# Patient Record
Sex: Female | Born: 1995 | Hispanic: No | Marital: Single | Smoking: Never smoker
Health system: Southern US, Community
[De-identification: ages and names within clinical notes are randomized; demographics above are authoritative.]

## PROBLEM LIST (undated history)

## (undated) DIAGNOSIS — M797 Fibromyalgia: Secondary | ICD-10-CM

## (undated) DIAGNOSIS — K589 Irritable bowel syndrome without diarrhea: Secondary | ICD-10-CM

## (undated) DIAGNOSIS — N200 Calculus of kidney: Secondary | ICD-10-CM

---

## 2020-01-31 ENCOUNTER — Emergency Department (HOSPITAL_COMMUNITY)
Admission: EM | Admit: 2020-01-31 | Discharge: 2020-01-31 | Disposition: A | Discharge status: Home / Self Care | Payer: No Typology Code available for payment source | Attending: Emergency Medicine | Admitting: Emergency Medicine

## 2020-01-31 ENCOUNTER — Encounter (HOSPITAL_COMMUNITY): Payer: Self-pay | Admitting: Emergency Medicine

## 2020-01-31 ENCOUNTER — Other Ambulatory Visit: Payer: Self-pay

## 2020-01-31 ENCOUNTER — Emergency Department (HOSPITAL_COMMUNITY): Payer: No Typology Code available for payment source

## 2020-01-31 DIAGNOSIS — N23 Unspecified renal colic: Secondary | ICD-10-CM | POA: Insufficient documentation

## 2020-01-31 DIAGNOSIS — R102 Pelvic and perineal pain: Secondary | ICD-10-CM

## 2020-01-31 DIAGNOSIS — Z79899 Other long term (current) drug therapy: Secondary | ICD-10-CM | POA: Diagnosis not present

## 2020-01-31 DIAGNOSIS — R109 Unspecified abdominal pain: Secondary | ICD-10-CM | POA: Diagnosis present

## 2020-01-31 LAB — COMPREHENSIVE METABOLIC PANEL
ALT: 13 U/L (ref 0–44)
AST: 16 U/L (ref 15–41)
Albumin: 3.8 g/dL (ref 3.5–5.0)
Alkaline Phosphatase: 79 U/L (ref 38–126)
Anion gap: 12 (ref 5–15)
BUN: 9 mg/dL (ref 6–20)
CO2: 25 mmol/L (ref 22–32)
Calcium: 9.4 mg/dL (ref 8.9–10.3)
Chloride: 102 mmol/L (ref 98–111)
Creatinine, Ser: 0.97 mg/dL (ref 0.44–1.00)
GFR calc Af Amer: >60 mL/min (ref >60)
GFR calc non Af Amer: >60 mL/min (ref >60)
Glucose, Bld: 100 mg/dL — ABNORMAL HIGH (ref 70–99)
Potassium: 3.8 mmol/L (ref 3.5–5.1)
Sodium: 139 mmol/L (ref 135–145)
Total Bilirubin: 0.5 mg/dL (ref 0.3–1.2)
Total Protein: 7.1 g/dL (ref 6.5–8.1)

## 2020-01-31 LAB — CBC WITH DIFFERENTIAL/PLATELET
Abs Immature Granulocytes: 0.01 10*3/uL (ref 0.00–0.07)
Basophils Absolute: 0.1 10*3/uL (ref 0.0–0.1)
Basophils Relative: 1 %
Eosinophils Absolute: 0.1 10*3/uL (ref 0.0–0.5)
Eosinophils Relative: 2 %
HCT: 44.3 % (ref 36.0–46.0)
Hemoglobin: 14.1 g/dL (ref 12.0–15.0)
Immature Granulocytes: 0 %
Lymphocytes Relative: 31 %
Lymphs Abs: 2.3 10*3/uL (ref 0.7–4.0)
MCH: 27.9 pg (ref 26.0–34.0)
MCHC: 31.8 g/dL (ref 30.0–36.0)
MCV: 87.5 fL (ref 80.0–100.0)
Monocytes Absolute: 0.4 10*3/uL (ref 0.1–1.0)
Monocytes Relative: 5 %
Neutro Abs: 4.5 10*3/uL (ref 1.7–7.7)
Neutrophils Relative %: 61 %
Platelets: 363 10*3/uL (ref 150–400)
RBC: 5.06 MIL/uL (ref 3.87–5.11)
RDW: 12.4 % (ref 11.5–15.5)
WBC: 7.4 10*3/uL (ref 4.0–10.5)
nRBC: 0 % (ref 0.0–0.2)

## 2020-01-31 LAB — URINALYSIS, ROUTINE W REFLEX MICROSCOPIC
Bacteria, UA: NONE SEEN
Bilirubin Urine: NEGATIVE
Glucose, UA: NEGATIVE mg/dL
Ketones, ur: NEGATIVE mg/dL
Leukocytes,Ua: NEGATIVE
Nitrite: NEGATIVE
Protein, ur: NEGATIVE mg/dL
RBC / HPF: >50 RBC/hpf — ABNORMAL HIGH (ref 0–5)
Specific Gravity, Urine: 1.021 (ref 1.005–1.030)
pH: 5 (ref 5.0–8.0)

## 2020-01-31 LAB — POC URINE PREG, ED: Preg Test, Ur: NEGATIVE

## 2020-01-31 MED ORDER — MORPHINE SULFATE (PF) 4 MG/ML IV SOLN
4.0000 mg | Freq: Once | INTRAVENOUS | Status: AC
  Administered 2020-01-31: 4 mg via INTRAVENOUS
  Filled 2020-01-31: qty 1

## 2020-01-31 MED ORDER — HYDROCODONE-ACETAMINOPHEN 5-325 MG PO TABS: 2.0000 | ORAL_TABLET | ORAL | 0 refills | Status: AC | PRN

## 2020-01-31 MED ORDER — SODIUM CHLORIDE 0.9 % IV BOLUS
1000.0000 mL | Freq: Once | INTRAVENOUS | Status: AC
  Administered 2020-01-31: 1000 mL via INTRAVENOUS

## 2020-01-31 NOTE — ED Triage Notes (Signed)
Pt reports RLQ and R flank pain since last night, has known renal stones that were found in November at another hospital. Pt denies any hematuria or other urinary symptoms. Tachycardic In triage, but states she has been seeing cards for the same. resp e/u, nad.

## 2020-01-31 NOTE — ED Provider Notes (Signed)
[facility] EMERGENCY DEPARTMENT Provider Note   CSN: [id number] Arrival date & time: 01/31/20  1142     History Chief Complaint  Patient presents with  . Flank Pain    Cheyenne Koch is a 24 y.o. female.  HPI 24 year old presents with left lower abdominal pain.  Started yesterday.  Much worse throughout the night.  Has had kidney stones before and similar pain in her left side where she was diagnosed with kidney stones in November.  States she was told she had many in her kidney that had not come out yet but were likely all small enough to pass.  No dysuria, hematuria, vaginal bleeding, or discharge.  No fevers.  She vomited once last night and feels nauseated today.  Took ibuprofen as well as her chronic fibromyalgia meds without help.  She has chronic back pain but no new back pain.  Pain is about 8 out of 10.  History reviewed. No pertinent past medical history.  There are no problems to display for this patient.   History reviewed. No pertinent surgical history.   OB History   No obstetric history on file.     No family history on file.  Social History   Tobacco Use  . Smoking status: Not on file  Substance Use Topics  . Alcohol use: Not on file  . Drug use: Not on file    Home Medications Prior to Admission medications   Medication Sig Start Date End Date Taking? Authorizing Provider  cholecalciferol (VITAMIN D3) 25 MCG (1000 UNIT) tablet Take 1,000 Units by mouth daily.   Yes [provider]  DULoxetine (CYMBALTA) 20 MG capsule Take 20 mg by mouth daily.   Yes [provider]  ibuprofen (ADVIL) 200 MG tablet Take 200 mg by mouth every 6 (six) hours as needed for moderate pain.   Yes [provider]  pregabalin (LYRICA) 25 MG capsule Take 25 mg by mouth 3 (three) times daily. 11/14/19  Yes [provider]  HYDROcodone-acetaminophen (NORCO) 5-325 MG tablet Take 2 tablets by mouth every 4 (four) hours as  needed. 01/31/20   Pricilla Loveless, MD    Allergies    Patient has no known allergies.  Review of Systems   Review of Systems  Constitutional: Negative for fever.  Gastrointestinal: Positive for abdominal pain, nausea and vomiting.  Genitourinary: Negative for dysuria, hematuria, vaginal bleeding and vaginal discharge.  Musculoskeletal: Positive for back pain.  All other systems reviewed and are negative.   Physical Exam Updated Vital Signs BP 110/63   Pulse 62   Temp 98.2 F (36.8 C) (Oral)   Resp 16   SpO2 97%   Physical Exam Vitals and nursing note reviewed.  Constitutional:      General: She is not in acute distress.    Appearance: She is well-developed. She is not ill-appearing or diaphoretic.  HENT:     Head: Normocephalic and atraumatic.     Right Ear: External ear normal.     Left Ear: External ear normal.     Nose: Nose normal.  Eyes:     General:        Right eye: No discharge.        Left eye: No discharge.  Cardiovascular:     Rate and Rhythm: Regular rhythm. Tachycardia present.     Heart sounds: Normal heart sounds.  Pulmonary:     Effort: Pulmonary effort is normal.     Breath sounds: Normal  breath sounds.  Abdominal:     Palpations: Abdomen is soft.     Tenderness: There is no abdominal tenderness (palpation of LLQ induces a "funny feeling" but no real tenderness). There is no right CVA tenderness or left CVA tenderness.  Skin:    General: Skin is warm and dry.  Neurological:     Mental Status: She is alert.  Psychiatric:        Mood and Affect: Mood is not anxious.     ED Results / Procedures / Treatments   Labs (all labs ordered are listed, but only abnormal results are displayed) Labs Reviewed  COMPREHENSIVE METABOLIC PANEL - Abnormal; Notable for the following components:      Result Value   Glucose, Bld 100 (*)    All other components within normal limits  URINALYSIS, ROUTINE W REFLEX MICROSCOPIC - Abnormal; Notable for the following  components:   APPearance HAZY (*)    Hgb urine dipstick SMALL (*)    RBC / HPF >50 (*)    All other components within normal limits  CBC WITH DIFFERENTIAL/PLATELET  POC URINE PREG, ED    EKG None  Radiology US Renal  Result Date: 01/31/2020 CLINICAL DATA:  24 year old with current history of urinary tract calculi presenting with LEFT flank pain. EXAM: RENAL / URINARY TRACT ULTRASOUND COMPLETE COMPARISON:  CT abdomen and pelvis 10/23/2019 and earlier from [place]. FINDINGS: Right Kidney: Renal measurements: Approximately 10.1 x 4.2 x 4.3 cm = volume: 96.4 mL . No hydronephrosis. Well-preserved cortex. Normal parenchymal echotexture. No focal parenchymal abnormality. Scattered nonshadowing echogenic foci which may represent the very small calculi identified on the prior CT. Left Kidney: Renal measurements: Approximately 9.8 x 4.3 x 4.7 cm = volume: 102.8 mL. No hydronephrosis. Well-preserved cortex. Normal parenchymal echotexture. No focal parenchymal abnormality. Scattered nonshadowing echogenic foci which may represent a very small calculi identified on the prior CT. Bladder: Normal in appearance. BILATERAL ureteral jets visible at color Doppler evaluation. Other: None. IMPRESSION: 1. Nonshadowing echogenic foci in both kidneys which may represent the very small BILATERAL renal calculi noted on the prior CT. 2. Otherwise normal examination. Specifically, no evidence of LEFT urinary tract obstruction. Electronically Signed   By: Evangeline Dakin M.D.   On: 01/31/2020 13:56   US PELVIC COMPLETE W TRANSVAGINAL AND TORSION R/O  Result Date: 01/31/2020 CLINICAL DATA:  24 year old presenting with LEFT-sided pelvic pain. LMP 2 weeks ago. EXAM: TRANSABDOMINAL AND TRANSVAGINAL ULTRASOUND OF PELVIS DOPPLER ULTRASOUND OF OVARIES TECHNIQUE: Both transabdominal and transvaginal ultrasound examinations of the pelvis were performed. Transabdominal technique was performed  for global imaging of the pelvis including uterus, ovaries, adnexal regions, and pelvic cul-de-sac. It was necessary to proceed with endovaginal exam following the transabdominal exam to visualize the endometrium and ovaries due to incomplete bladder distension. Color and duplex Doppler ultrasound was utilized to evaluate blood flow to the ovaries. COMPARISON:  No prior ultrasound. CT abdomen and pelvis 10/23/2019 at [place]. FINDINGS: Uterus Measurements: Approximately 8.2 x 3.4 x 4.3 cm = volume: 82.9 mL. Homogeneous echotexture without focal fibroid or other myometrial abnormality. Normal-appearing uterine cervix. Endometrium Thickness: Approximately 3 mm. Normal appearance without evidence of endometrial fluid or mass. Right ovary Measurements: Approximately 3.0 x 1.7 x 1.9 cm = volume: 5.2 mL. Small follicular cysts. No dominant cyst or solid mass. Normal color Doppler flow within the ovary. Left ovary Measurements: Approximately 3.1 x 2.2 x 2.3 cm = volume: 7.9 mL.  Small follicular cysts. No dominant cyst or solid mass. Normal color Doppler flow within the ovary. Pulsed Doppler evaluation of both ovaries demonstrates normal low-resistance arterial and venous waveforms. IMPRESSION: Normal examination. Electronically Signed   By: Hulan Saas M.D.   On: 01/31/2020 14:00    Procedures Procedures (including critical care time)  Medications Ordered in ED Medications  morphine 4 MG/ML injection 4 mg (4 mg Intravenous Given 01/31/20 1243)  sodium chloride 0.9 % bolus 1,000 mL (0 mLs Intravenous Stopped 01/31/20 1404)    ED Course  I have reviewed the triage vital signs and the nursing notes.  Pertinent labs & imaging results that were available during my care of the patient were reviewed by me and considered in my medical decision making (see chart for details).    MDM Rules/Calculators/A&P                      Repeat abdominal exam shows a continued  benign abdomen.  She has had pain like this before with possible ureteral colic.  My suspicion is this is probably mild ureteral colic given the hematuria and her known kidney stones.  I do not see an indication for CT as at this point she has had multiple CTs and this would increase her radiation load and likely not change management.  My suspicion for other intra-abdominal emergency is very low, such as diverticulitis, AAA, etc.  No torsion on ultrasound.  No GYN complaints.  Will treat pain and have her follow-up with her urologist. Final Clinical Impression(s) / ED Diagnoses Final diagnoses:  Ureteral colic    Rx / DC Orders ED Discharge Orders         Ordered    HYDROcodone-acetaminophen (NORCO) 5-325 MG tablet  Every 4 hours PRN     01/31/20 1450           Pricilla Loveless, MD 01/31/20 1515

## 2020-01-31 NOTE — Discharge Instructions (Signed)
If you develop worsening, continued, or recurrent abdominal pain, uncontrolled vomiting, fever, chest or back pain, or any other new/concerning symptoms then return to the ER for evaluation.  

## 2020-03-24 ENCOUNTER — Emergency Department (HOSPITAL_COMMUNITY): Payer: Self-pay

## 2020-03-24 ENCOUNTER — Encounter (HOSPITAL_COMMUNITY): Payer: Self-pay | Admitting: Emergency Medicine

## 2020-03-24 ENCOUNTER — Emergency Department (HOSPITAL_COMMUNITY)
Admission: EM | Admit: 2020-03-24 | Discharge: 2020-03-25 | Disposition: A | Discharge status: Home / Self Care | Payer: Self-pay | Attending: Emergency Medicine | Admitting: Emergency Medicine

## 2020-03-24 ENCOUNTER — Other Ambulatory Visit: Payer: Self-pay

## 2020-03-24 DIAGNOSIS — R1032 Left lower quadrant pain: Secondary | ICD-10-CM | POA: Insufficient documentation

## 2020-03-24 DIAGNOSIS — Z5321 Procedure and treatment not carried out due to patient leaving prior to being seen by health care provider: Secondary | ICD-10-CM | POA: Insufficient documentation

## 2020-03-24 HISTORY — DX: Calculus of kidney: N20.0

## 2020-03-24 HISTORY — DX: Irritable bowel syndrome, unspecified: K58.9

## 2020-03-24 HISTORY — DX: Fibromyalgia: M79.7

## 2020-03-24 LAB — URINALYSIS, ROUTINE W REFLEX MICROSCOPIC
Bilirubin Urine: NEGATIVE
Glucose, UA: NEGATIVE mg/dL
Ketones, ur: NEGATIVE mg/dL
Leukocytes,Ua: NEGATIVE
Nitrite: NEGATIVE
Protein, ur: 30 mg/dL — AB
RBC / HPF: >50 RBC/hpf — ABNORMAL HIGH (ref 0–5)
Specific Gravity, Urine: 1.025 (ref 1.005–1.030)
pH: 6 (ref 5.0–8.0)

## 2020-03-24 LAB — COMPREHENSIVE METABOLIC PANEL
ALT: 14 U/L (ref 0–44)
AST: 17 U/L (ref 15–41)
Albumin: 4.1 g/dL (ref 3.5–5.0)
Alkaline Phosphatase: 77 U/L (ref 38–126)
Anion gap: 12 (ref 5–15)
BUN: 12 mg/dL (ref 6–20)
CO2: 23 mmol/L (ref 22–32)
Calcium: 9.4 mg/dL (ref 8.9–10.3)
Chloride: 105 mmol/L (ref 98–111)
Creatinine, Ser: 0.95 mg/dL (ref 0.44–1.00)
GFR calc Af Amer: >60 mL/min (ref >60)
GFR calc non Af Amer: >60 mL/min (ref >60)
Glucose, Bld: 106 mg/dL — ABNORMAL HIGH (ref 70–99)
Potassium: 3.9 mmol/L (ref 3.5–5.1)
Sodium: 140 mmol/L (ref 135–145)
Total Bilirubin: 0.8 mg/dL (ref 0.3–1.2)
Total Protein: 7.9 g/dL (ref 6.5–8.1)

## 2020-03-24 LAB — CBC
HCT: 47.7 % — ABNORMAL HIGH (ref 36.0–46.0)
Hemoglobin: 15.4 g/dL — ABNORMAL HIGH (ref 12.0–15.0)
MCH: 28.3 pg (ref 26.0–34.0)
MCHC: 32.3 g/dL (ref 30.0–36.0)
MCV: 87.5 fL (ref 80.0–100.0)
Platelets: 425 10*3/uL — ABNORMAL HIGH (ref 150–400)
RBC: 5.45 MIL/uL — ABNORMAL HIGH (ref 3.87–5.11)
RDW: 13 % (ref 11.5–15.5)
WBC: 12.9 10*3/uL — ABNORMAL HIGH (ref 4.0–10.5)
nRBC: 0 % (ref 0.0–0.2)

## 2020-03-24 LAB — I-STAT BETA HCG BLOOD, ED (MC, WL, AP ONLY): I-stat hCG, quantitative: <5 m[IU]/mL (ref <5)

## 2020-03-24 LAB — LIPASE, BLOOD: Lipase: 24 U/L (ref 11–51)

## 2020-03-24 MED ORDER — ACETAMINOPHEN 325 MG PO TABS
650.0000 mg | ORAL_TABLET | Freq: Once | ORAL | Status: AC
  Administered 2020-03-24: 650 mg via ORAL
  Filled 2020-03-24: qty 2

## 2020-03-24 MED ORDER — SODIUM CHLORIDE 0.9% FLUSH: 3.0000 mL | Freq: Once | INTRAVENOUS | Status: DC

## 2020-03-24 NOTE — ED Triage Notes (Signed)
Pt arrives to ED from home with complaints of sharp LLQ abdominal pain starting this morning. Patient states that she has had x5 episodes of diarhhrea and x5 episodes of emesis. Patient has hx of kidney stones that feels like same.

## 2020-03-25 NOTE — ED Notes (Signed)
No answer x2 for vitals recheck 

## 2020-03-25 NOTE — ED Notes (Signed)
No answer x1 for vitals recheck 

## 2020-07-12 IMAGING — CT CT RENAL STONE PROTOCOL
2 of 4 series · 17 of 46 positions shown, 19 images · non-contrast
Comparison: October 23, 2019

CLINICAL DATA: Left flank pain.

EXAM:
CT ABDOMEN AND PELVIS WITHOUT CONTRAST
TECHNIQUE: Multidetector CT imaging of the abdomen and pelvis was performed
following the standard protocol without IV contrast.

[Series 3: renal stone 5.0 · axial · 0.85mm/px · z∈[+769,+1194]mm · 14 of 93 slices shown, 16 images]
[im 4/93  soft-tissue]
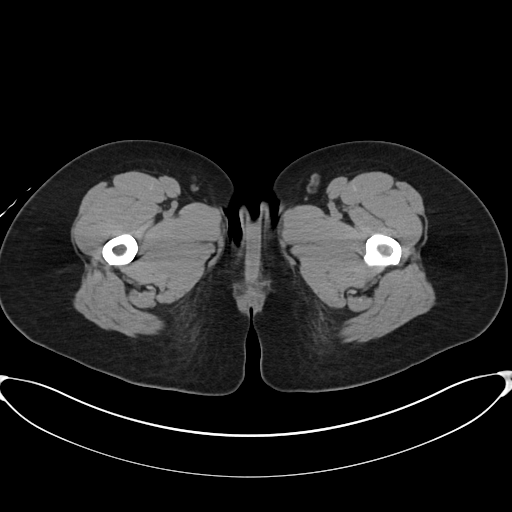
[im 4/93  bone]
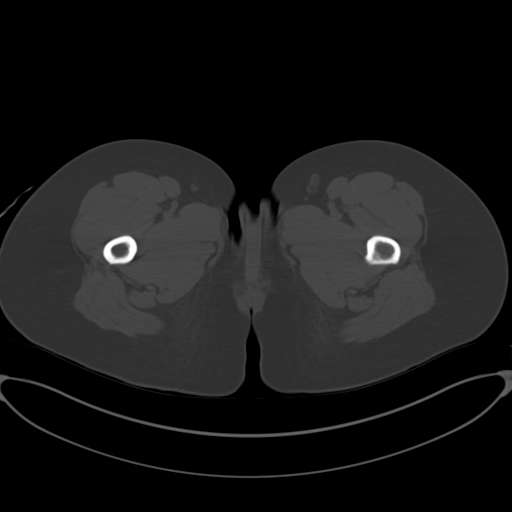
[im 11/93  soft-tissue]
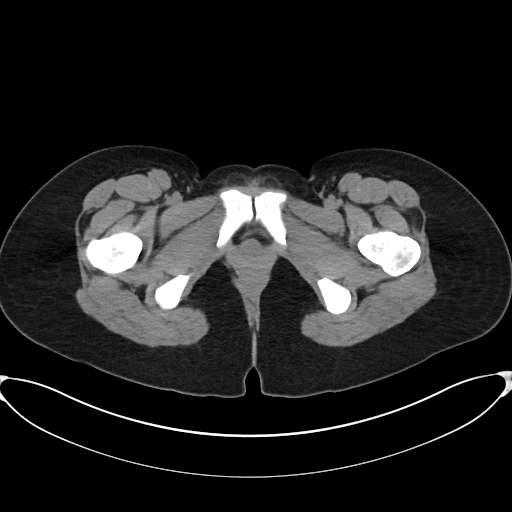
[im 18/93  soft-tissue]
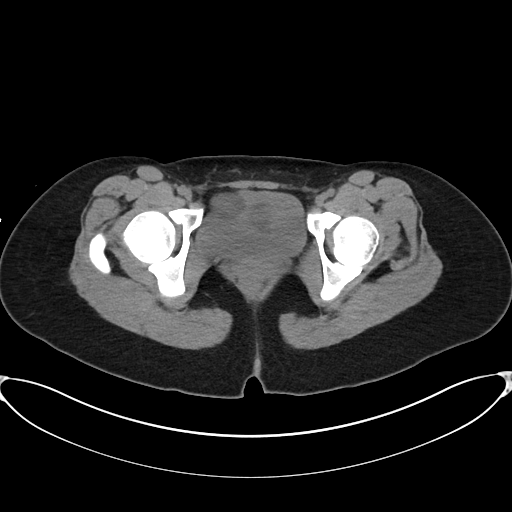
[im 25/93  soft-tissue]
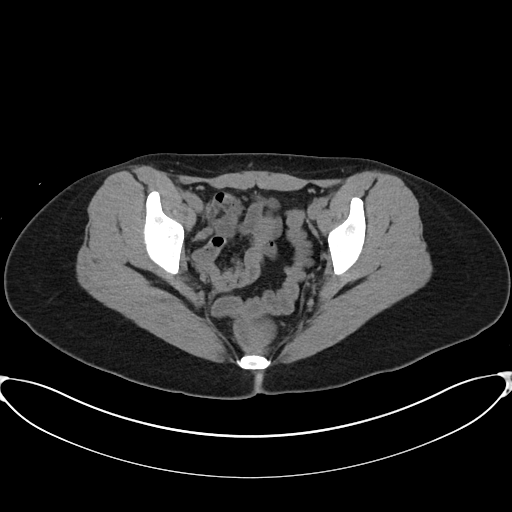
[im 32/93  soft-tissue]
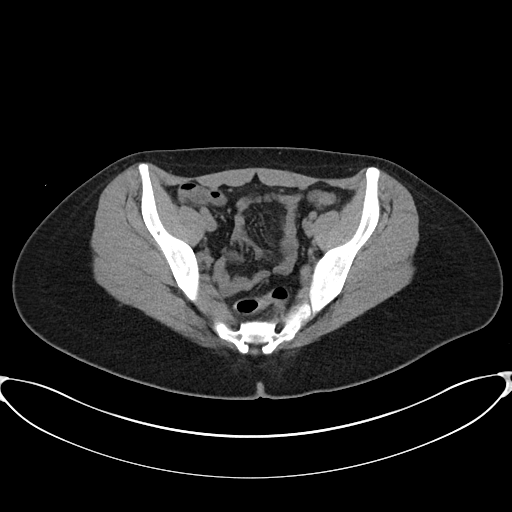
[im 36/93  soft-tissue]
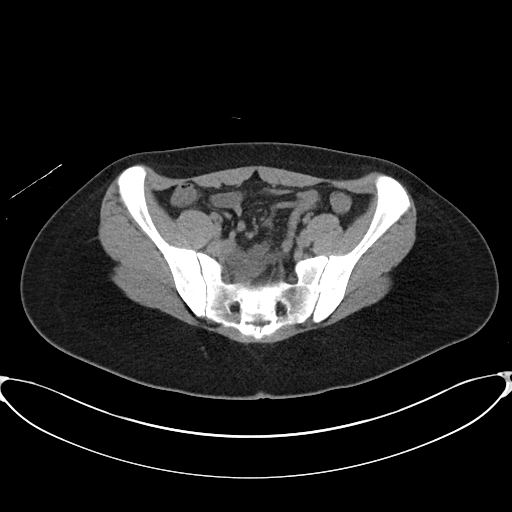
[im 43/93  soft-tissue]
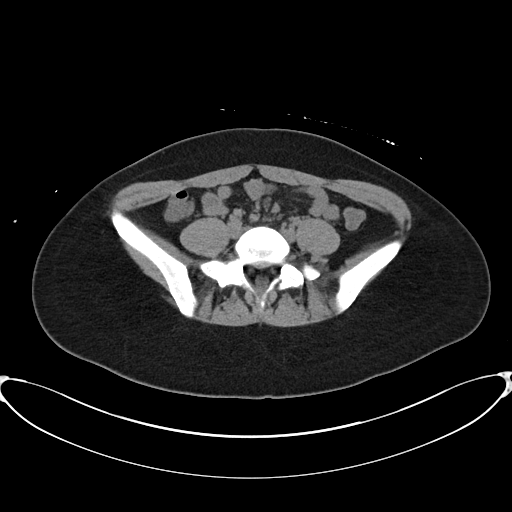
[im 50/93  soft-tissue]
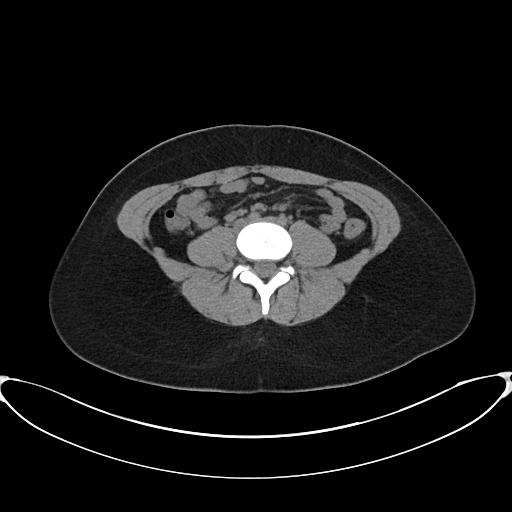
[im 57/93  soft-tissue]
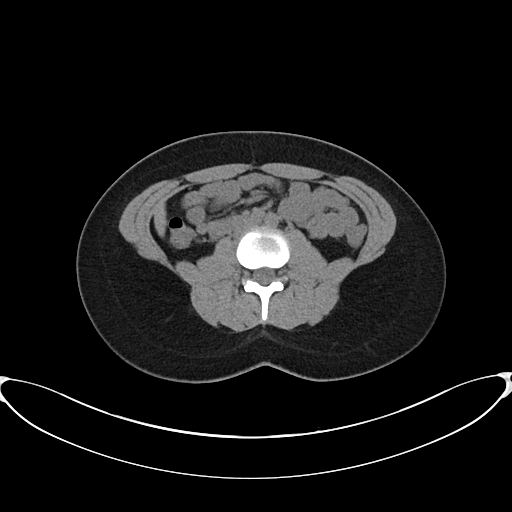
[im 57/93  bone]
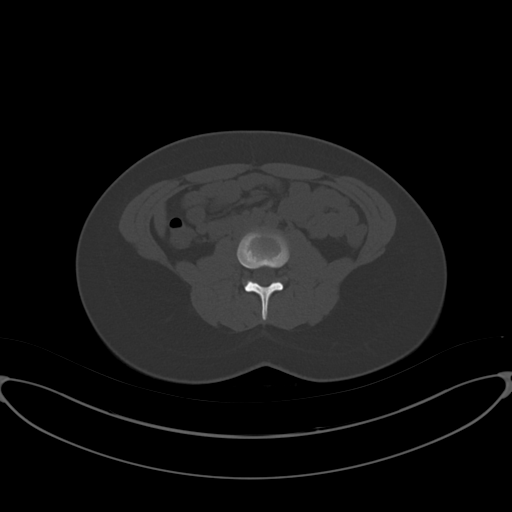
[im 61/93  soft-tissue]
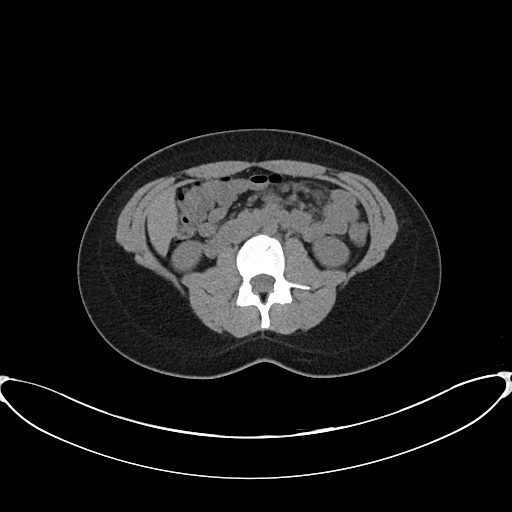
[im 68/93  soft-tissue]
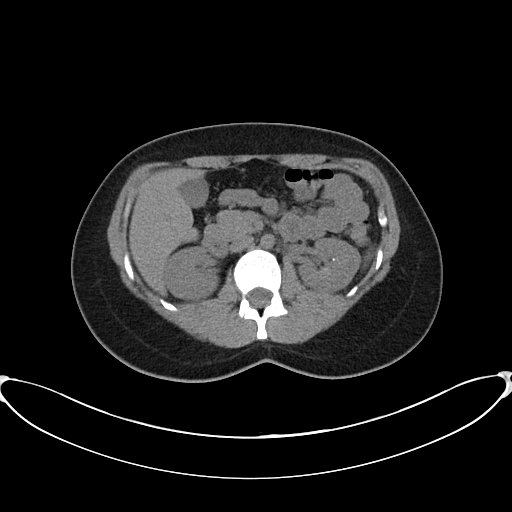
[im 75/93  soft-tissue]
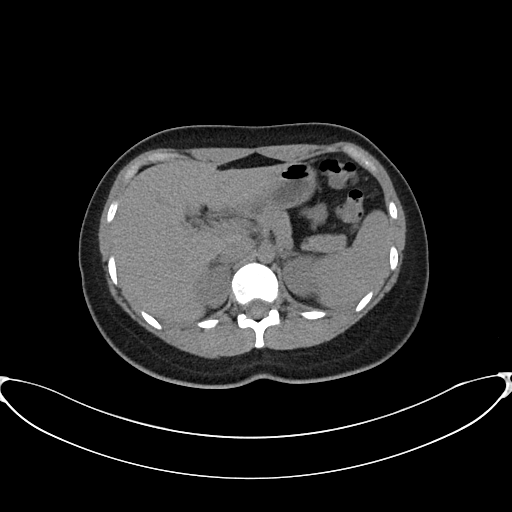
[im 82/93  soft-tissue]
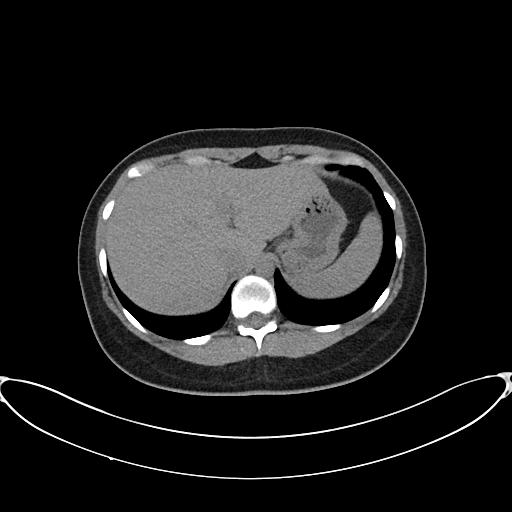
[im 89/93  soft-tissue]
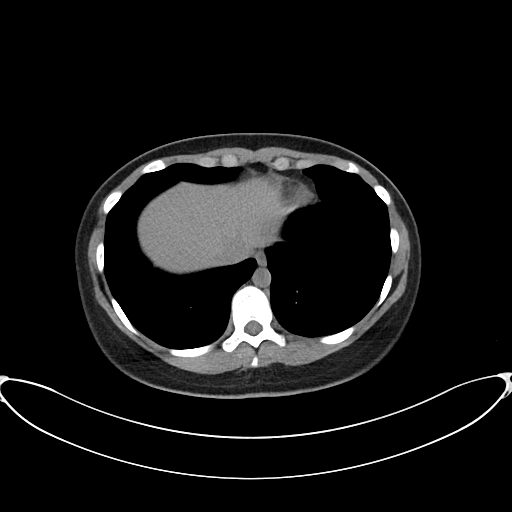

[Series 6: coronal · coronal · 0.87mm/px · 3 of 88 slices shown]
[im 30/88  soft-tissue]
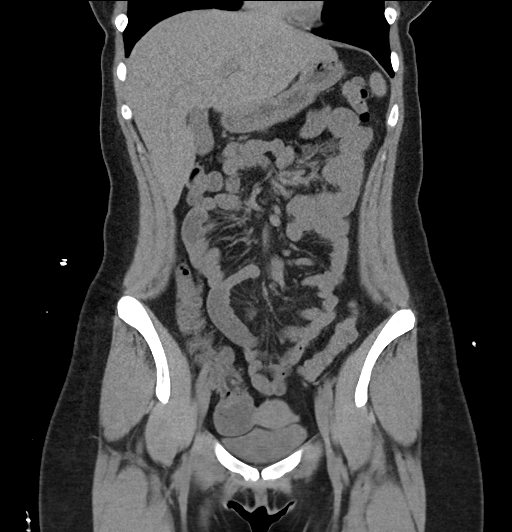
[im 39/88  soft-tissue]
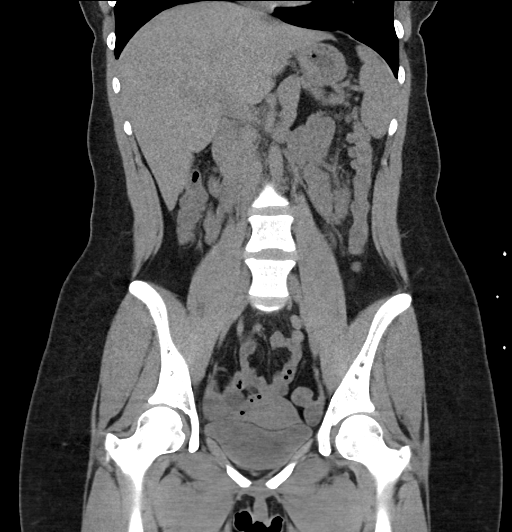
[im 49/88  soft-tissue]
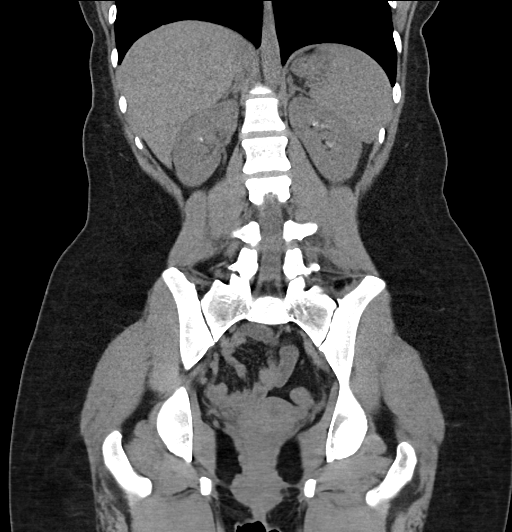

[17 of 46 positions shown; findings below may reference images not displayed]

FINDINGS: Lower chest: No acute abnormality.

Hepatobiliary: No focal liver abnormality is seen. No gallstones,
gallbladder wall thickening, or biliary dilatation.

Pancreas: Unremarkable. No pancreatic ductal dilatation or
surrounding inflammatory changes.

Spleen: Normal in size without focal abnormality.

Adrenals/Urinary Tract: Adrenal glands are unremarkable. Kidneys are
normal in size without focal lesions or hydronephrosis. Numerous
bilateral 2 mm nonobstructing renal stones are seen. Bladder is
unremarkable.

Stomach/Bowel: Stomach is within normal limits. The appendix is not
identified. No evidence of bowel wall thickening, distention, or
inflammatory changes.

Vascular/Lymphatic: No significant vascular findings are present. No
enlarged abdominal or pelvic lymph nodes.

Reproductive: Uterus and bilateral adnexa are unremarkable.

Other: No abdominal wall hernia or abnormality. No abdominopelvic
ascites.

Musculoskeletal: No acute or significant osseous findings.
IMPRESSION: Numerous bilateral 2 mm nonobstructing renal stones.

## 2023-08-13 ENCOUNTER — Other Ambulatory Visit: Payer: Self-pay

## 2023-08-13 ENCOUNTER — Encounter (HOSPITAL_COMMUNITY): Payer: Self-pay

## 2023-08-13 ENCOUNTER — Emergency Department (HOSPITAL_COMMUNITY)
Admission: EM | Admit: 2023-08-13 | Discharge: 2023-08-13 | Disposition: A | Discharge status: Home / Self Care | Payer: Managed Care, Other (non HMO) | Attending: Emergency Medicine | Admitting: Emergency Medicine

## 2023-08-13 ENCOUNTER — Emergency Department (HOSPITAL_COMMUNITY): Payer: Managed Care, Other (non HMO)

## 2023-08-13 DIAGNOSIS — R1084 Generalized abdominal pain: Secondary | ICD-10-CM

## 2023-08-13 DIAGNOSIS — R110 Nausea: Secondary | ICD-10-CM

## 2023-08-13 DIAGNOSIS — R109 Unspecified abdominal pain: Secondary | ICD-10-CM | POA: Diagnosis present

## 2023-08-13 LAB — URINALYSIS, ROUTINE W REFLEX MICROSCOPIC
Bilirubin Urine: NEGATIVE
Glucose, UA: NEGATIVE mg/dL
Ketones, ur: NEGATIVE mg/dL
Leukocytes,Ua: NEGATIVE
Nitrite: NEGATIVE
Protein, ur: NEGATIVE mg/dL
RBC / HPF: >50 RBC/hpf (ref 0–5)
Specific Gravity, Urine: 1.016 (ref 1.005–1.030)
pH: 5 (ref 5.0–8.0)

## 2023-08-13 LAB — CBC
HCT: 42.9 % (ref 36.0–46.0)
Hemoglobin: 14 g/dL (ref 12.0–15.0)
MCH: 28.1 pg (ref 26.0–34.0)
MCHC: 32.6 g/dL (ref 30.0–36.0)
MCV: 86 fL (ref 80.0–100.0)
Platelets: 338 10*3/uL (ref 150–400)
RBC: 4.99 MIL/uL (ref 3.87–5.11)
RDW: 13.4 % (ref 11.5–15.5)
WBC: 7.4 10*3/uL (ref 4.0–10.5)
nRBC: 0 % (ref 0.0–0.2)

## 2023-08-13 LAB — COMPREHENSIVE METABOLIC PANEL
ALT: 13 U/L (ref 0–44)
AST: 16 U/L (ref 15–41)
Albumin: 4.1 g/dL (ref 3.5–5.0)
Alkaline Phosphatase: 64 U/L (ref 38–126)
Anion gap: 8 (ref 5–15)
BUN: 14 mg/dL (ref 6–20)
CO2: 23 mmol/L (ref 22–32)
Calcium: 8.9 mg/dL (ref 8.9–10.3)
Chloride: 104 mmol/L (ref 98–111)
Creatinine, Ser: 1.09 mg/dL — ABNORMAL HIGH (ref 0.44–1.00)
GFR, Estimated: >60 mL/min (ref >60)
Glucose, Bld: 90 mg/dL (ref 70–99)
Potassium: 3.8 mmol/L (ref 3.5–5.1)
Sodium: 135 mmol/L (ref 135–145)
Total Bilirubin: 0.7 mg/dL (ref 0.3–1.2)
Total Protein: 7.6 g/dL (ref 6.5–8.1)

## 2023-08-13 LAB — HCG, SERUM, QUALITATIVE: Preg, Serum: NEGATIVE

## 2023-08-13 LAB — LIPASE, BLOOD: Lipase: 30 U/L (ref 11–51)

## 2023-08-13 MED ORDER — IOHEXOL 300 MG/ML  SOLN
100.0000 mL | Freq: Once | INTRAMUSCULAR | Status: AC | PRN
  Administered 2023-08-13: 100 mL via INTRAVENOUS

## 2023-08-13 MED ORDER — ONDANSETRON 4 MG PO TBDP: 4.0000 mg | ORAL_TABLET | Freq: Three times a day (TID) | ORAL | 0 refills | Status: AC | PRN

## 2023-08-13 MED ORDER — SODIUM CHLORIDE 0.9 % IV BOLUS
1000.0000 mL | Freq: Once | INTRAVENOUS | Status: AC
  Administered 2023-08-13: 1000 mL via INTRAVENOUS

## 2023-08-13 NOTE — Discharge Instructions (Signed)
It was a pleasure taking care of you here in the emergency department  Your labs and CT scan were reassuring it could be that you passed a small kidney stone  We have given you Zofran as needed for any nausea or vomiting.  If you develop any persistent, worsening plain please seek reevaluation

## 2023-08-13 NOTE — ED Provider Notes (Signed)
[place] EMERGENCY DEPARTMENT AT [facility] Provider Note   CSN: [id number] Arrival date & time: 08/13/23  0725    History  Chief Complaint  Patient presents with   Abdominal Pain    Cheyenne Koch is a 27 y.o. female past medical history significant for kidney stones, IBS, fibromyalgia here for evaluation of right side abdominal pain.  Began approximately 2 hours.  Some nausea without emesis.  Radiates into her flank.  Initially had worsening pain which has improved.  No change in bowel movements.  No blood in stool.  Takes Lyrica for fibromyalgia.  No fever, chest pain, shortness of breath, cough, congestion, rhinorrhea, no bloody emesis, bloody stool.  Noticed hematuria.  Does have history of kidney stones. On menstrual cycle, no vaginal discharge, lower abdominal pain, no concern for STD  HPI     Home Medications Prior to Admission medications   Medication Sig Start Date End Date Taking? Authorizing Provider  ondansetron (ZOFRAN-ODT) 4 MG disintegrating tablet Take 1 tablet (4 mg total) by mouth every 8 (eight) hours as needed for nausea or vomiting. 08/13/23  Yes Nafeesah Lapaglia A, PA-C  cholecalciferol (VITAMIN D3) 25 MCG (1000 UNIT) tablet Take 1,000 Units by mouth daily.    [provider]  DULoxetine (CYMBALTA) 20 MG capsule Take 20 mg by mouth daily.    [provider]  HYDROcodone-acetaminophen (NORCO) 5-325 MG tablet Take 2 tablets by mouth every 4 (four) hours as needed. 01/31/20   Pricilla Loveless, MD  ibuprofen (ADVIL) 200 MG tablet Take 200 mg by mouth every 6 (six) hours as needed for moderate pain.    [provider]  pregabalin (LYRICA) 25 MG capsule Take 25 mg by mouth 3 (three) times daily. 11/14/19   [provider]      Allergies    Patient has no known allergies.    Review of Systems   Review of Systems  Constitutional: Negative.   HENT: Negative.    Respiratory: Negative.    Cardiovascular: Negative.    Gastrointestinal:  Positive for abdominal pain and nausea. Negative for abdominal distention, anal bleeding, blood in stool, constipation, diarrhea, rectal pain and vomiting.  Genitourinary: Negative.   Musculoskeletal: Negative.   Skin: Negative.   Neurological: Negative.   All other systems reviewed and are negative.   Physical Exam Updated Vital Signs BP 105/68   Pulse 64   Temp 98.9 F (37.2 C) (Oral)   Resp 16   Ht 5\' 5"  (1.651 m)   Wt 72.6 kg   LMP 08/09/2023   SpO2 100%   BMI 26.63 kg/m  Physical Exam Vitals and nursing note reviewed.  Constitutional:      General: She is not in acute distress.    Appearance: She is well-developed. She is not ill-appearing or diaphoretic.  HENT:     Head: Normocephalic and atraumatic.  Eyes:     Pupils: Pupils are equal, round, and reactive to light.  Cardiovascular:     Rate and Rhythm: Normal rate and regular rhythm.     Heart sounds: Normal heart sounds.  Pulmonary:     Effort: Pulmonary effort is normal. No respiratory distress.     Breath sounds: Normal breath sounds.  Abdominal:     General: Bowel sounds are normal. There is no distension.     Palpations: Abdomen is soft.     Tenderness: There is generalized abdominal tenderness. There is no right CVA tenderness, left CVA tenderness, guarding or rebound. Negative signs  include Murphy's sign and McBurney's sign.     Hernia: No hernia is present.     Comments: Diffusely tender to right abdomen, negative CVA tap bilaterally.  Negative Murphy sign, McBurney point.  Musculoskeletal:        General: Normal range of motion.     Cervical back: Normal range of motion and neck supple.  Skin:    General: Skin is warm and dry.  Neurological:     General: No focal deficit present.     Mental Status: She is alert and oriented to person, place, and time.     ED Results / Procedures / Treatments   Labs (all labs ordered are listed, but only abnormal results are displayed) Labs  Reviewed  COMPREHENSIVE METABOLIC PANEL - Abnormal; Notable for the following components:      Result Value   Creatinine, Ser 1.09 (*)    All other components within normal limits  URINALYSIS, ROUTINE W REFLEX MICROSCOPIC - Abnormal; Notable for the following components:   APPearance HAZY (*)    Hgb urine dipstick LARGE (*)    Bacteria, UA RARE (*)    All other components within normal limits  LIPASE, BLOOD  CBC  HCG, SERUM, QUALITATIVE    EKG None  Radiology CT ABDOMEN PELVIS W CONTRAST  Result Date: 08/13/2023 CLINICAL DATA:  Right-sided abdominal pain. Concern for nephrolithiasis. EXAM: CT ABDOMEN AND PELVIS WITH CONTRAST TECHNIQUE: Multidetector CT imaging of the abdomen and pelvis was performed using the standard protocol following bolus administration of intravenous contrast. RADIATION DOSE REDUCTION: This exam was performed according to the departmental dose-optimization program which includes automated exposure control, adjustment of the mA and/or kV according to patient size and/or use of iterative reconstruction technique. CONTRAST:  OMNIPAQUE IOHEXOL 300 MG/ML  SOLN COMPARISON:  03/24/2020 FINDINGS: Lower chest: Limited visualization of the lower thorax demonstrates minimal left basilar subpleural ground-glass atelectasis. No discrete focal airspace opacities. No pleural effusion. Normal heart size.  No pericardial effusion. Hepatobiliary: Normal hepatic contour. There is mild diffuse decreased attenuation of the hepatic parenchyma suggestive of hepatic steatosis. There is a minimal amount of focal fatty infiltration adjacent to the fissure for the ligamentum teres. No discrete worrisome hepatic lesions. Normal appearance of the gallbladder given degree of distention. No radiopaque gallstones. No intra or extrahepatic biliary duct dilatation. No ascites. Pancreas: Normal appearance of the pancreas. Spleen: Normal appearance of the spleen. Adrenals/Urinary Tract: There is  symmetric enhancement of the bilateral kidneys. There are 4 punctate (1-2 mm) nonobstructing left-sided renal stones (coronal images 65, 66, 68 and 69, series 8), as well as 2 punctate (1-2 mm), nonobstructing right-sided renal stones (images 68 and 71, series 8). No definitive renal stones are seen along the expected course of either ureter. Punctate phleboliths are seen within the lower pelvis bilaterally. Normal appearance of the urinary bladder given degree of distention. No urinary obstruction or perinephric stranding. Normal appearance of the bilateral adrenal glands. Stomach/Bowel: Normal appearance of the terminal ileum. The appendix is not visualized, however there is no pericecal inflammatory change. No hiatal hernia. No pneumoperitoneum, pneumatosis or portal venous gas. Vascular/Lymphatic: Normal caliber of the abdominal aorta. The major branch vessels of the abdominal aorta appear patent on this non CTA examination. No bulky retroperitoneal mesenteric, pelvic or inguinal lymphadenopathy. Reproductive: Normal appearance of the pelvic organs for age. No discrete adnexal lesions. No free fluid in the pelvic cul-de-sac. Other: Very minimal amount of subcutaneous edema about the midline of the low back. Musculoskeletal:  No acute or aggressive osseous abnormalities. IMPRESSION: 1. No acute findings within the abdomen or pelvis to explain patient's right-sided abdominal pain. 2. Bilateral punctate (1-2 mm) nonobstructing renal stones. No definitive renal stones are seen along the expected course of either ureter. No urinary obstruction or perinephric stranding. 3. Suspected hepatic steatosis.  Correlation with LFTs is advised. Electronically Signed   By: Simonne Come M.D.   On: 08/13/2023 09:40    Procedures Procedures    Medications Ordered in ED Medications  sodium chloride 0.9 % bolus 1,000 mL (0 mLs Intravenous Stopped 08/13/23 1011)  iohexol (OMNIPAQUE) 300 MG/ML solution 100 mL (100 mLs  Intravenous Contrast Given 08/13/23 1027)    ED Course/ Medical Decision Making/ A&P   27 year old here for evaluation of diffuse right-sided abdominal pain and nausea.  Goes into her flank.  No recent injury or trauma.  Not worse with food intake, movement.  No change in bowel movements, blood in stool.  History of kidney stones.  She is currently on her menstrual cycle.  She has no lower abdominal pain, vaginal bleeding or concern for STD.  She has some diffuse tenderness to her right abdomen however no focal pain.  Negative Murphy sign, and McBurney point, negative CVA tap.  No urinary symptoms.  Will plan on labs, imaging and reassess.  Does not anything for pain or nausea at this time.  Labs and imaging personally viewed and interpreted:  CBC without leukocytosis Creatinine 1.09--baseline 0.95>> received IV fluids Preg negative Lipase 30 UA negative for infection, does show blood however on menstrual cycle CT abdomen pelvis with some punctate renal stones  Patient reassessed.  Has not needed anything for pain or nausea.  She is tolerating p.o. intake.  We discussed her labs and imaging.  Question whether she passed a kidney stone.  Negative Murphy sign, pain does not seem consistent with cholelithiasis, cholecystitis, choledocholithiasis.  No UTI symptoms to suggest pyelonephritis, urine clean.  No chest pain, shortness of breath, cough to suggest acute intrathoracic etiology of her upper abdominal pain.  No blood in stools to suggest GI bleed.  Discussed symptomatic management, have her follow-up outpatient.  She is agreeable.  Patient is nontoxic, nonseptic appearing, in no apparent distress.  Patient's pain and other symptoms adequately managed in emergency department.  Fluid bolus given.  Labs, imaging and vitals reviewed.  Patient does not meet the SIRS or Sepsis criteria.  On repeat exam patient does not have a surgical abdomin and there are no peritoneal signs.  No indication of  appendicitis, bowel obstruction, bowel perforation, cholecystitis, diverticulitis, PID, intermittent/persistent torsion, TOA or ectopic pregnancy.  Patient discharged home with symptomatic treatment and given strict instructions for follow-up with their primary care physician.  I have also discussed reasons to return immediately to the ER.  Patient expresses understanding and agrees with plan.                                  Medical Decision Making Amount and/or Complexity of Data Reviewed Independent Historian: spouse External Data Reviewed: labs, radiology and notes. Labs: ordered. Decision-making details documented in ED Course. Radiology: ordered and independent interpretation performed. Decision-making details documented in ED Course.  Risk OTC drugs. Prescription drug management. Parenteral controlled substances. Decision regarding hospitalization. Diagnosis or treatment significantly limited by social determinants of health.           Final Clinical Impression(s) / ED Diagnoses Final  diagnoses:  Generalized abdominal pain  Nausea    Rx / DC Orders ED Discharge Orders          Ordered    ondansetron (ZOFRAN-ODT) 4 MG disintegrating tablet  Every 8 hours PRN        08/13/23 1058              Malin Cervini A, PA-C 08/13/23 1126    Estelle June A, DO 08/16/23 1701

## 2023-08-13 NOTE — ED Triage Notes (Signed)
Pt coming in for a right sided abd pain that radiates around to her back. Pt states it began around 0530, and does endorse nausea.

## 2023-08-13 NOTE — ED Notes (Signed)
Gave patient a cup of ice water.
